# Patient Record
Sex: Male | Born: 1980 | Race: White | Hispanic: No | Marital: Single | Smoking: Never smoker
Health system: Southern US, Community
[De-identification: ages and names within clinical notes are randomized; demographics above are authoritative.]

## PROBLEM LIST (undated history)

## (undated) HISTORY — PX: ELEVATION OF DEPRESSED SKULL FRACTURE: SUR431

---

## 2010-02-14 DIAGNOSIS — R1033 Periumbilical pain: Secondary | ICD-10-CM | POA: Insufficient documentation

## 2010-02-17 ENCOUNTER — Ambulatory Visit: Payer: Self-pay | Admitting: Family Medicine

## 2010-02-17 ENCOUNTER — Emergency Department (HOSPITAL_COMMUNITY): Admission: EM | Admit: 2010-02-17 | Discharge: 2010-02-17 | Payer: Self-pay | Admitting: Emergency Medicine

## 2010-02-17 DIAGNOSIS — K219 Gastro-esophageal reflux disease without esophagitis: Secondary | ICD-10-CM

## 2010-05-24 NOTE — Assessment & Plan Note (Signed)
Summary: NEW TO EST-DIARRHEA X 4 DAYS-OK PER RACHEL//CCM   Vital Signs:  Patient profile:   30 year old male Height:      69 inches Weight:      176 pounds BMI:     26.08 Temp:     98.2 degrees F oral BP sitting:   110 / 80  (left arm) Cuff size:   regular  Vitals Entered By: Kern Reap CMA Duncan Dull) (February 17, 2010 1:50 PM) CC: new to establish care, NVD x 4 days Is Patient Diabetic? No   CC:  new to establish care and NVD x 4 days.  History of Present Illness: Larry Jacobs is a 30 year old single male, who comes in today with a 4-day history of nausea and vomiting, abdominal cramps, and diarrhea.  He states over the past weekend.  He lives in [place], [place], and ate some raw oysters.  About 10 hours later, he developed abdominal cramps, and diarrhea.  It has persisted since that, time.  He's been unable to eat.  Every time he eats he vomits.  He's had 3 loose problems today, which he states, are dark, coffee ground in appearance, but no blood or red.  He said any fever, chills, or urinary complaints.  Review of systems otherwise negative except for history of a traumatic injury resulted in a skull fracture.  It was required surgical intervention.  No residual brain damage  Preventive Screening-Counseling & Management  Alcohol-Tobacco     Smoking Status: never      Drug Use:  no.    Allergies (verified): 1)  Dilantin  Past History:  Past medical, surgical, family and social histories (including risk factors) reviewed for relevance to current acute and chronic problems.  Past Medical History: GERD  Past Surgical History: fractured skull  Family History: Reviewed history and no changes required. Father: GERD, Gout, HTN Mother: healthy Siblings: 2  brothers                1  gout               1 colitis  Social History: Reviewed history and no changes required. Single Never Smoked Alcohol use-yes Drug use-no Occupation: liquor rep Smoking Status:   never Drug Use:  no  Review of Systems      See HPI  Physical Exam  General:  Well-developed,well-nourished,in no acute distress; alert,appropriate and cooperative throughout examination Abdomen:  the abdomen is soft.  The bowel sounds are normal.  There is diffuse abdominal tenderness throughout the abdomen with referred rebound tenderness to the periumbilical area   Problems:  Medical Problems Added: 1)  Dx of Abdominal Pain, Periumbilical  (ICD-789.05) 2)  Dx of Genella Rife  (ICD-530.81)  Impression & Recommendations:  Problem # 1:  ABDOMINAL PAIN, PERIUMBILICAL (ICD-789.05) Assessment New  Patient Instructions: 1)  go directly to [facility] long emergency room   Orders Added: 1)  New Patient Level IV [57846]

## 2010-07-06 LAB — CBC
Hemoglobin: 17.8 g/dL — ABNORMAL HIGH (ref 13.0–17.0)
MCH: 33 pg (ref 26.0–34.0)
MCHC: 35.5 g/dL (ref 30.0–36.0)
MCV: 92.9 fL (ref 78.0–100.0)
Platelets: 249 10*3/uL (ref 150–400)
RBC: 5.39 MIL/uL (ref 4.22–5.81)

## 2010-07-06 LAB — DIFFERENTIAL
Basophils Relative: 1 % (ref 0–1)
Eosinophils Absolute: 0.3 10*3/uL (ref 0.0–0.7)
Eosinophils Relative: 3 % (ref 0–5)
Lymphs Abs: 2 10*3/uL (ref 0.7–4.0)
Monocytes Relative: 8 % (ref 3–12)

## 2010-07-06 LAB — POCT I-STAT, CHEM 8
Calcium, Ion: 1.18 mmol/L (ref 1.12–1.32)
Creatinine, Ser: 1.3 mg/dL (ref 0.4–1.5)
Glucose, Bld: 92 mg/dL (ref 70–99)
HCT: 52 % (ref 39.0–52.0)
Hemoglobin: 17.7 g/dL — ABNORMAL HIGH (ref 13.0–17.0)
TCO2: 31 mmol/L (ref 0–100)

## 2011-09-17 IMAGING — CT CT ABD-PELV W/ CM
1 series · 15 of 32 positions shown, 19 images · IV contrast (agent unspecified)
Comparison: None.

CLINICAL DATA: 29-year-old male with right lower quadrant abdominal
pain, nausea, vomiting.

CT ABDOMEN AND PELVIS WITH CONTRAST
TECHNIQUE: Multidetector CT imaging of the abdomen and pelvis was
performed following the standard protocol during bolus
administration of intravenous contrast.
Contrast: 100 ml 5mnipaque-6MM.

[Series 2: rtn ap with st · axial · 0.69mm/px · z∈[-328,+102]mm · 15 of 97 slices shown, 19 images]
[im 7/97  soft-tissue]
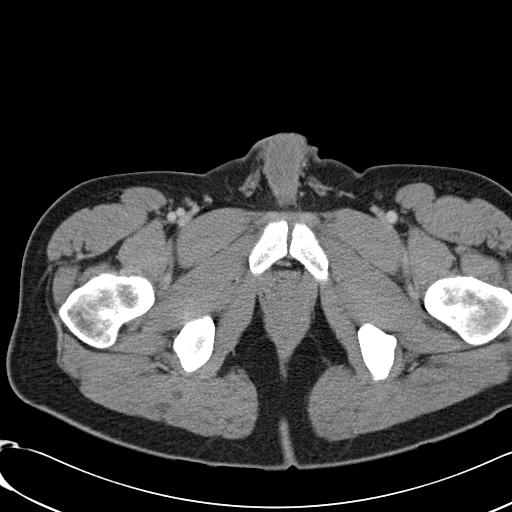
[im 7/97  bone]
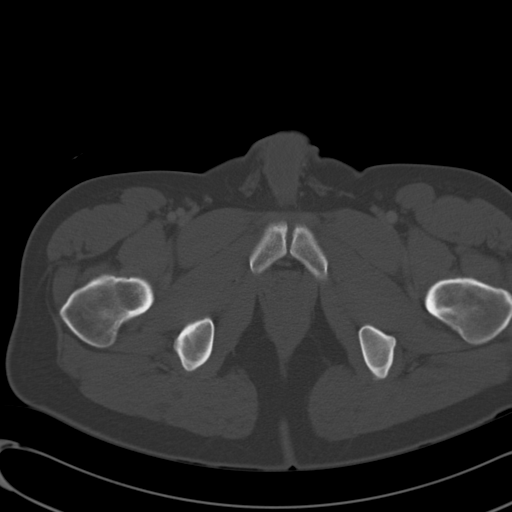
[im 13/97  soft-tissue]
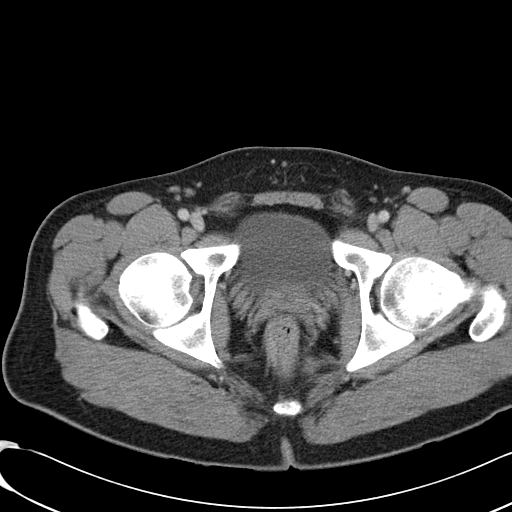
[im 19/97  soft-tissue]
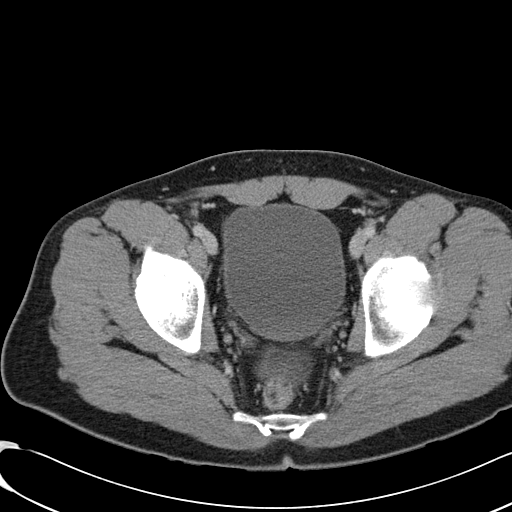
[im 28/97  soft-tissue]
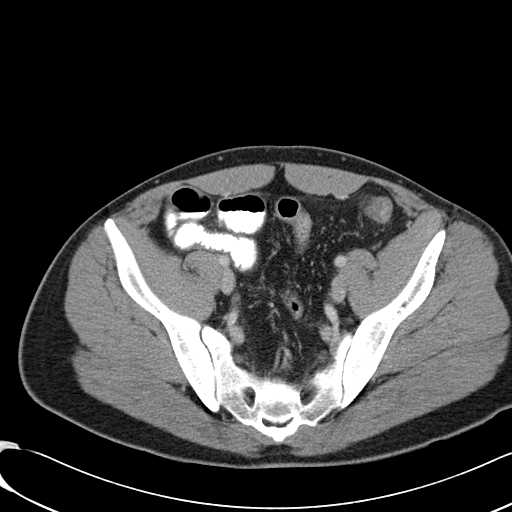
[im 35/97  soft-tissue]
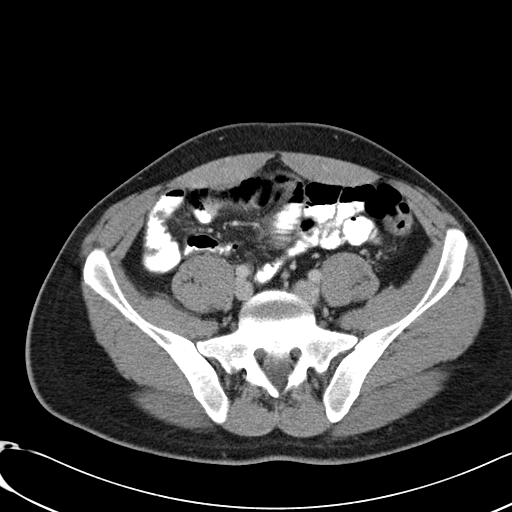
[im 41/97  soft-tissue]
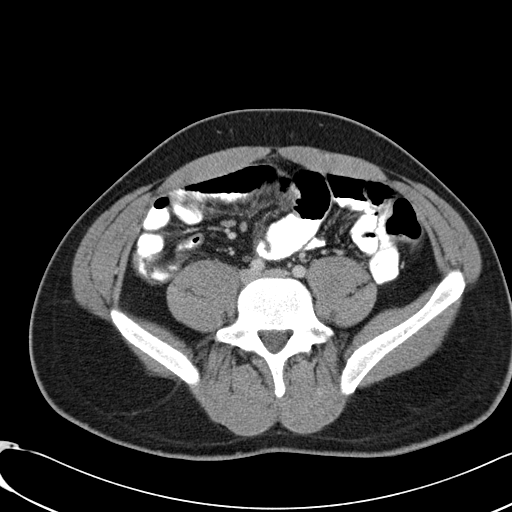
[im 50/97  soft-tissue]
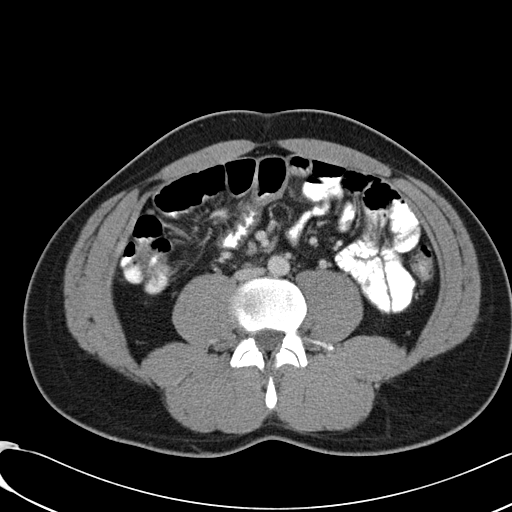
[im 56/97  soft-tissue]
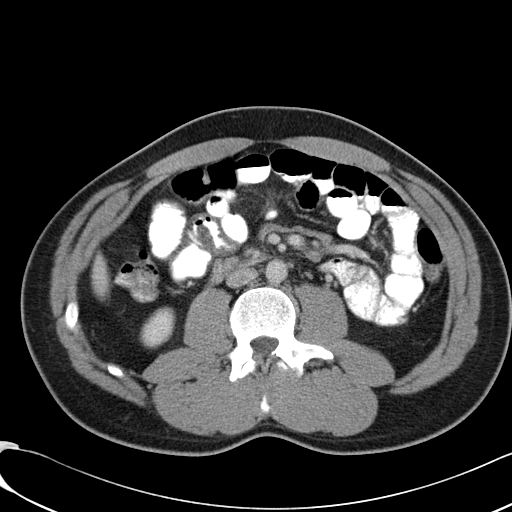
[im 62/97  soft-tissue]
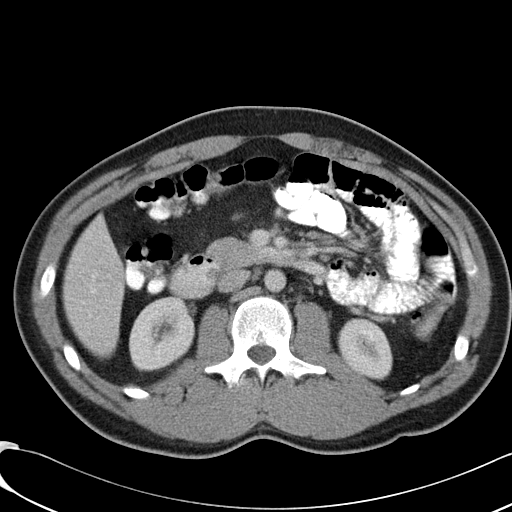
[im 62/97  bone]
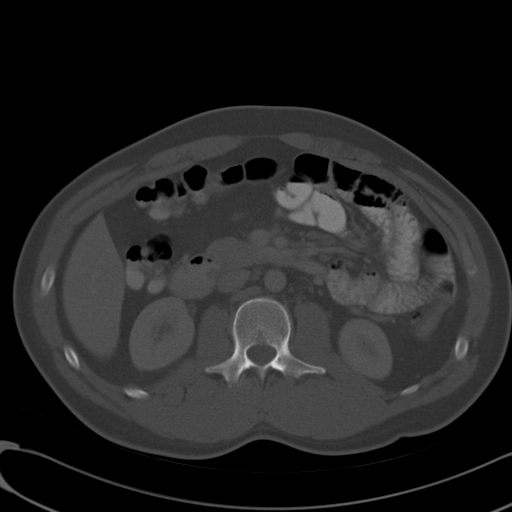
[im 69/97  soft-tissue]
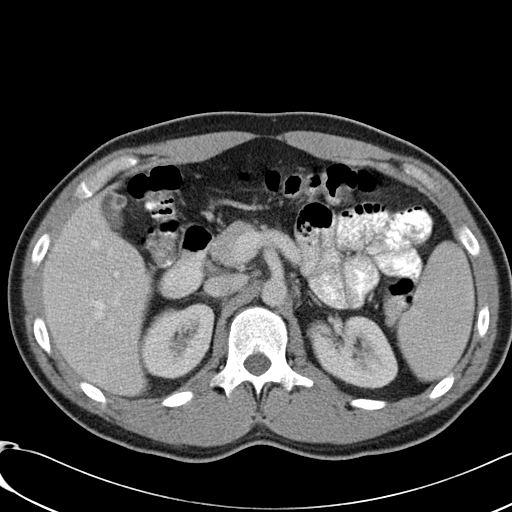
[im 78/97  soft-tissue]
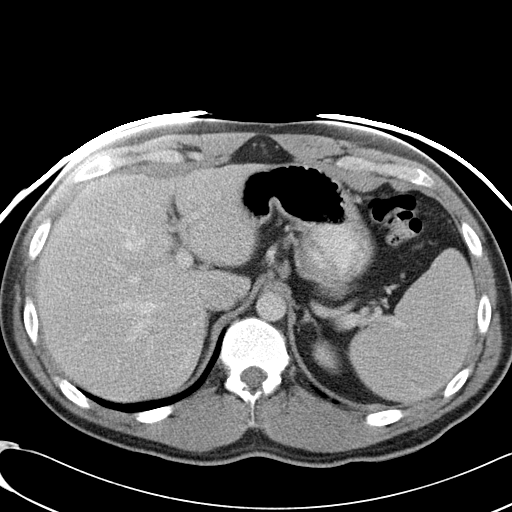
[im 84/97  soft-tissue]
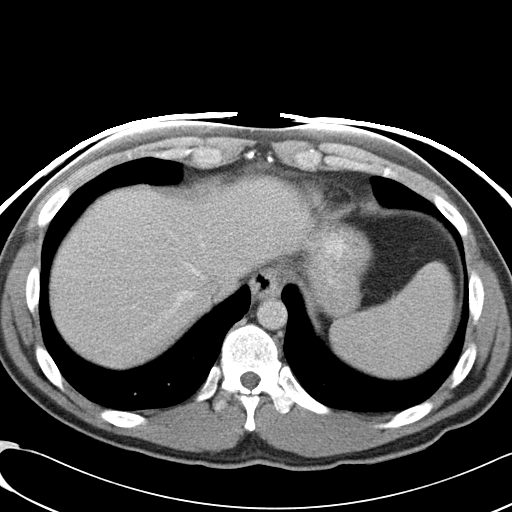
[im 84/97  lung]
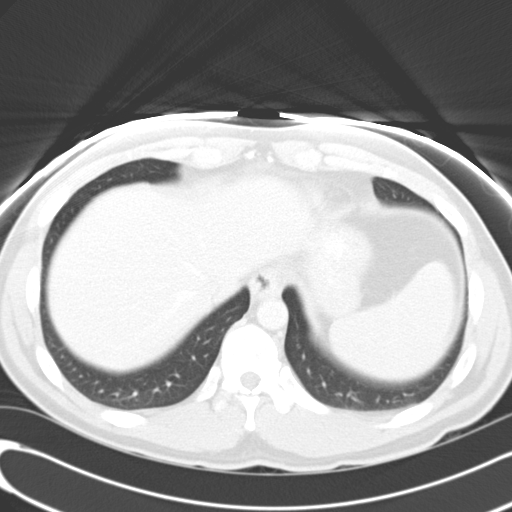
[im 87/97  lung]
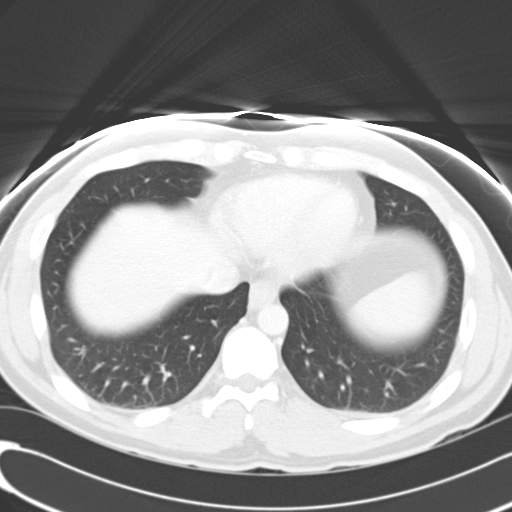
[im 90/97  soft-tissue]
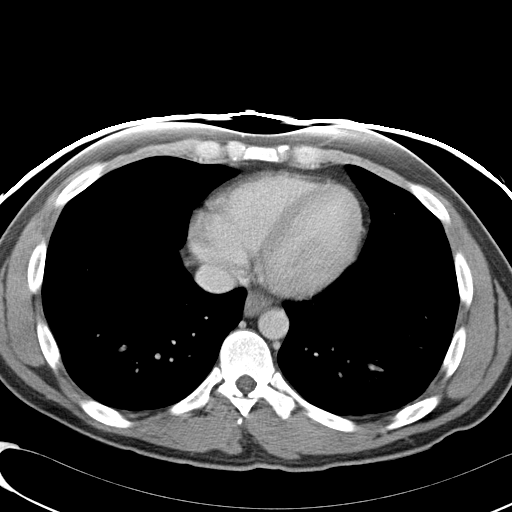
[im 90/97  lung]
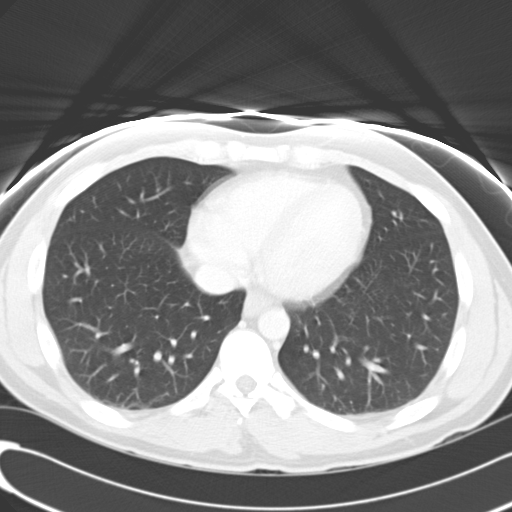
[im 93/97  lung]
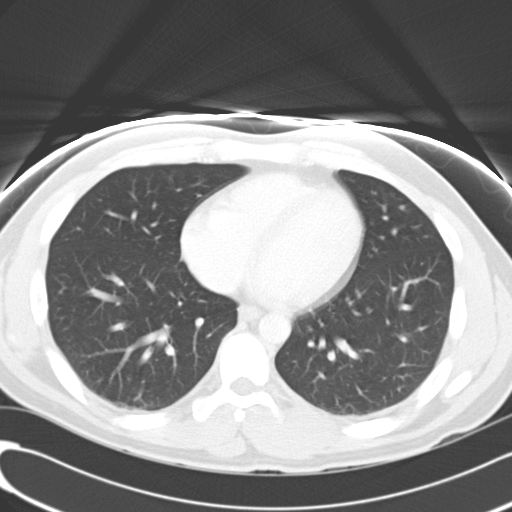

[15 of 32 positions shown; findings below may reference images not displayed]

FINDINGS: Minor atelectasis at the lung bases.No acute osseous
abnormality identified.  Small volume of pelvic free fluid.  Oral
contrast has reached the transverse colon.  The appendix appears
diminutive but not inflamed the located adjacent to the right psoas
muscle on series 2 image 58.  No focal colon inflammatory changes.

The bladder, liver, gallbladder, spleen, pancreas, adrenal glands,
kidneys, portal venous system, and major arterial structures are
within normal limits.  Stomach, duodenum, and proximal small bowel
loops are within normal limits.

Prominent small bowel mesenteric lymph nodes measure up to 8 mm in
short axis.  Distal small bowel loops appear mildly thickened and
irregular ( series 2 image 60, coronal image 37), but the terminal
ileum itself does not appear severely affected.
IMPRESSION: 1.  Mild thickening of distal small bowel loops, reactive type
lymphadenopathy in the lower small bowel mesentery, and small
volume free fluid in the pelvis.  Suspect acute infectious
enteritis versus mesenteric adenitis.
2.  No convincing evidence of acute appendicitis.

## 2012-02-21 ENCOUNTER — Encounter (HOSPITAL_COMMUNITY): Payer: Self-pay

## 2012-02-21 ENCOUNTER — Emergency Department (HOSPITAL_COMMUNITY)
Admission: EM | Admit: 2012-02-21 | Discharge: 2012-02-21 | Disposition: A | Discharge status: Home / Self Care | Payer: BC Managed Care – PPO | Source: Home / Self Care | Attending: Family Medicine | Admitting: Family Medicine

## 2012-02-21 DIAGNOSIS — J329 Chronic sinusitis, unspecified: Secondary | ICD-10-CM

## 2012-02-21 MED ORDER — AMOXICILLIN-POT CLAVULANATE 875-125 MG PO TABS: 1.0000 | ORAL_TABLET | Freq: Two times a day (BID) | ORAL | Status: DC

## 2012-02-21 NOTE — ED Notes (Signed)
Patient complains of sinus pain, pressure and drainage  X 4 days , OTC not helpful

## 2012-02-21 NOTE — ED Provider Notes (Signed)
History     CSN: [id number]  Arrival date & time 02/21/12  1205   First MD Initiated Contact with Patient 02/21/12 1440      Chief Complaint  Patient presents with  . Facial Pain    (Consider location/radiation/quality/duration/timing/severity/associated sxs/prior treatment) Patient is a 31 y.o. male presenting with cough. The history is provided by the patient. No language interpreter was used.  Cough This is a new problem. The problem occurs constantly. The problem has been gradually worsening. The cough is productive of sputum. There has been no fever. Associated symptoms include ear congestion, ear pain and rhinorrhea. He is not a smoker. His past medical history does not include pneumonia.  Pt complains of a sinus infection  History reviewed. No pertinent past medical history.  History reviewed. No pertinent past surgical history.  No family history on file.  History  Substance Use Topics  . Smoking status: Never Smoker   . Smokeless tobacco: Not on file  . Alcohol Use: Yes      Review of Systems  HENT: Positive for ear pain and rhinorrhea.   Respiratory: Positive for cough.   All other systems reviewed and are negative.    Allergies  Phenytoin  Home Medications  No current outpatient prescriptions on file.  BP 115/79  Pulse 88  Temp 98.2 F (36.8 C) (Oral)  Resp 22  SpO2 97%  Physical Exam  Vitals reviewed. Constitutional: He appears well-developed and well-nourished.  HENT:  Head: Normocephalic and atraumatic.  Right Ear: External ear normal.  Left Ear: External ear normal.  Nose: Nose normal.  Mouth/Throat: Oropharynx is clear and moist.       Tender maxillary sinuses  Eyes: Conjunctivae normal and EOM are normal. Pupils are equal, round, and reactive to light.  Neck: Normal range of motion. Neck supple.  Cardiovascular: Normal rate and normal heart sounds.   Pulmonary/Chest: Effort normal.  Abdominal: Soft.  Musculoskeletal: Normal range  of motion.  Neurological: He is alert.  Skin: Skin is warm.    ED Course  Procedures (including critical care time)  Labs Reviewed - No data to display No results found.   No diagnosis found.    MDM  augmentin 875 x a0 days        Lonia Skinner [place], [place] 02/21/12 1444

## 2012-02-22 NOTE — ED Provider Notes (Signed)
Medical screening examination/treatment/procedure(s) were performed by non-physician practitioner and as supervising physician I was immediately available for consultation/collaboration.   [facility]; MD   Sharin Grave, MD 02/22/12 [phone]

## 2015-05-12 ENCOUNTER — Encounter: Payer: Self-pay | Admitting: Allergy and Immunology

## 2015-05-12 ENCOUNTER — Ambulatory Visit (INDEPENDENT_AMBULATORY_CARE_PROVIDER_SITE_OTHER): Payer: BLUE CROSS/BLUE SHIELD | Admitting: Allergy and Immunology

## 2015-05-12 VITALS — BP 110/76 | HR 76 | Temp 98.0°F | Resp 16 | Ht 69.29 in | Wt 175.3 lb

## 2015-05-12 DIAGNOSIS — J309 Allergic rhinitis, unspecified: Secondary | ICD-10-CM

## 2015-05-12 DIAGNOSIS — H1010 Acute atopic conjunctivitis, unspecified eye: Secondary | ICD-10-CM | POA: Diagnosis not present

## 2015-05-12 MED ORDER — OLOPATADINE HCL 0.7 % OP SOLN: 1.0000 [drp] | OPHTHALMIC | Status: AC

## 2015-05-12 MED ORDER — MONTELUKAST SODIUM 10 MG PO TABS: 10.0000 mg | ORAL_TABLET | Freq: Every day | ORAL | Status: DC

## 2015-05-12 NOTE — Patient Instructions (Signed)
  1.  Allergen avoidance measures  2. Treat and prevent inflammation:   A. OTC Rhinocort - one spray each nostril 3-7 times per week  B. Montelukast 10 mg one tablet one time per day  3. If needed:   A. OTC antihistamine.  [name] - one drop each eye one time per day. Can use prior to contact insertion.   4. Immunotherapy?  5. Return in 12 weeks or earlier if problem.

## 2015-05-12 NOTE — Progress Notes (Signed)
[facility]    NEW PATIENT NOTE  Referring Provider: No ref. provider found Primary Provider: No PCP Per Patient Date of office visit: 05/12/2015    Subjective:   Chief Complaint:  Larry Jacobs is a 35 y.o. male with a chief complaint of New Patient (Initial Visit)  who presents to the clinic with the following problems:  HPI Comments: Larry Jacobs presents to this clinic on 05/12/2015 in evaluation of allergies. He's had a long history of problems with nasal congestion and lowing of his nose and intermittent sneezing and itchy watery eyes usually occurring during the spring and fall season initially. Unfortunately, over the course of the past several years he has now developed perennial symptoms. He does not note any obvious provoking factor giving rise to these symptoms. He does not have any associated anosmia or headaches or ugly nasal discharge or other atopic disease. He has tried various medications including what sounds like nasal steroids and he's used antihistamines.   History reviewed. No pertinent past medical history.  Past Surgical History  Procedure Laterality Date  . Elevation of depressed skull fracture       Current outpatient prescriptions:  .  cetirizine (ZYRTEC) 10 MG tablet, Take 10 mg by mouth as needed for allergies., Disp: , Rfl:  .  amoxicillin-clavulanate (AUGMENTIN) 875-125 MG per tablet, Take 1 tablet by mouth 2 (two) times daily. (Patient not taking: Reported on 05/12/2015), Disp: 20 tablet, Rfl: 0  No orders of the defined types were placed in this encounter.    Allergies  Allergen Reactions  . Phenytoin     REACTION: hives    Review of systems negative except as noted in HPI / PMHx or noted below:  Review of Systems  Constitutional: Negative.   HENT: Negative.   Eyes: Negative.        Corrective contact ware  Respiratory: Negative.   Cardiovascular: Negative.   Gastrointestinal:  Negative.   Genitourinary: Negative.   Musculoskeletal: Negative.   Skin: Negative.   Neurological: Negative.   Endo/Heme/Allergies: Negative.   Psychiatric/Behavioral: Negative.     Family History  Problem Relation Age of Onset  . Asthma Father     Social History   Social History  . Marital Status: Single    Spouse Name: N/A  . Number of Children: N/A  . Years of Education: N/A   Occupational History  . Not on file.   Social History Main Topics  . Smoking status: Never Smoker   . Smokeless tobacco: Not on file  . Alcohol Use: 1.8 oz/week    3 Shots of liquor per week  . Drug Use: No  . Sexual Activity: Not on file   Other Topics Concern  . Not on file   Social History Narrative    Environmental and Social history  Lives in a house with a dry environment, a dog located inside the household, no carpeting in the bedroom, sleeping on a mattress without plastic for the better pillow, and no smokers located inside the household. 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   Objective:   Filed Vitals:   05/12/15 0900  BP: 110/76  Pulse: 76  Temp: 98 F (36.7 C)  Resp: 16   Height: 5' 9.29" (176 cm) Weight: 175 lb 4.3 oz (79.5 kg)  Physical Exam  Constitutional: He is well-developed, well-nourished, and in no distress. No distress.  HENT:  Head: Normocephalic. Head is without  right periorbital erythema and without left periorbital erythema.  Right Ear: Tympanic membrane, external ear and ear canal normal.  Left Ear: Tympanic membrane, external ear and ear canal normal.  Nose: Nose normal. No mucosal edema or rhinorrhea.  Mouth/Throat: Oropharynx is clear and moist and mucous membranes are normal. No oropharyngeal exudate.  Eyes: Conjunctivae and lids are normal. Pupils are equal, round, and reactive to light.  Neck: Trachea normal. No tracheal deviation present. No thyromegaly present.  Cardiovascular: Normal rate, regular rhythm, S1  normal, S2 normal and normal heart sounds.   No murmur heard. Pulmonary/Chest: Effort normal. No stridor. No tachypnea. No respiratory distress. He has no wheezes. He has no rales. He exhibits no tenderness.  Abdominal: Soft. He exhibits no distension and no mass. There is no hepatosplenomegaly. There is no tenderness. There is no rebound and no guarding.  Musculoskeletal: He exhibits no edema or tenderness.  Lymphadenopathy:       Head (right side): No tonsillar adenopathy present.       Head (left side): No tonsillar adenopathy present.    He has no cervical adenopathy.    He has no axillary adenopathy.  Neurological: He is alert. Gait normal.  Skin: No rash noted. He is not diaphoretic. No erythema. No pallor. Nails show no clubbing.  Psychiatric: Mood and affect normal.     Diagnostics:  Allergy skin tests were performed. He demonstrated severe hypersensitivity against house dust mite on epi cutaneous testing and he demonstrated hypersensitivity against mold on intradermal testing  Assessment and Plan:    1. Allergic rhinoconjunctivitis     1.  Allergen avoidance measures  2. Treat and prevent inflammation:   A. OTC Rhinocort - one spray each nostril 3-7 times per week  B. Montelukast 10 mg one tablet one time per day  3. If needed:   A. OTC antihistamine.  [name] - one drop each eye one time per day. Can use prior to contact insertion.   4. Immunotherapy?  5. Return in 12 weeks or earlier if problem.  Larry Jacobs will perform allergen avoidance measures and use anti-inflammatory medications on a regular basis and will see how things go over the course of the next 12 weeks. If he fails medical therapy he would definitely be a candidate for immunotherapy.  Laurette Schimke, MD [facility]

## 2015-06-06 ENCOUNTER — Emergency Department (HOSPITAL_COMMUNITY)
Admission: EM | Admit: 2015-06-06 | Discharge: 2015-06-06 | Disposition: A | Discharge status: Home / Self Care | Payer: BLUE CROSS/BLUE SHIELD | Attending: Emergency Medicine | Admitting: Emergency Medicine

## 2015-06-06 DIAGNOSIS — Z79899 Other long term (current) drug therapy: Secondary | ICD-10-CM | POA: Diagnosis not present

## 2015-06-06 DIAGNOSIS — J029 Acute pharyngitis, unspecified: Secondary | ICD-10-CM | POA: Insufficient documentation

## 2015-06-06 DIAGNOSIS — R52 Pain, unspecified: Secondary | ICD-10-CM | POA: Diagnosis present

## 2015-06-06 DIAGNOSIS — R531 Weakness: Secondary | ICD-10-CM | POA: Insufficient documentation

## 2015-06-06 LAB — BASIC METABOLIC PANEL
ANION GAP: 7 (ref 5–15)
BUN: 14 mg/dL (ref 6–20)
CHLORIDE: 104 mmol/L (ref 101–111)
CO2: 26 mmol/L (ref 22–32)
CREATININE: 1.02 mg/dL (ref 0.61–1.24)
Calcium: 8.6 mg/dL — ABNORMAL LOW (ref 8.9–10.3)
GFR calc non Af Amer: >60 mL/min (ref >60)
GLUCOSE: 98 mg/dL (ref 65–99)
Potassium: 4.2 mmol/L (ref 3.5–5.1)
Sodium: 137 mmol/L (ref 135–145)

## 2015-06-06 LAB — CK: Total CK: 352 U/L (ref 49–397)

## 2015-06-06 LAB — HEPATIC FUNCTION PANEL
ALBUMIN: 4.2 g/dL (ref 3.5–5.0)
ALK PHOS: 48 U/L (ref 38–126)
ALT: 20 U/L (ref 17–63)
AST: 33 U/L (ref 15–41)
Bilirubin, Direct: 0.2 mg/dL (ref 0.1–0.5)
Indirect Bilirubin: 0.7 mg/dL (ref 0.3–0.9)
TOTAL PROTEIN: 6.8 g/dL (ref 6.5–8.1)
Total Bilirubin: 0.9 mg/dL (ref 0.3–1.2)

## 2015-06-06 LAB — CBC WITH DIFFERENTIAL/PLATELET
BASOS PCT: 0 %
Basophils Absolute: 0 10*3/uL (ref 0.0–0.1)
Eosinophils Absolute: 0.1 10*3/uL (ref 0.0–0.7)
Eosinophils Relative: 1 %
HEMATOCRIT: 42.3 % (ref 39.0–52.0)
HEMOGLOBIN: 15.4 g/dL (ref 13.0–17.0)
LYMPHS PCT: 31 %
Lymphs Abs: 2.1 10*3/uL (ref 0.7–4.0)
MCH: 31.3 pg (ref 26.0–34.0)
MCHC: 36.4 g/dL — AB (ref 30.0–36.0)
MCV: 86 fL (ref 78.0–100.0)
MONOS PCT: 13 %
Monocytes Absolute: 0.9 10*3/uL (ref 0.1–1.0)
NEUTROS ABS: 3.8 10*3/uL (ref 1.7–7.7)
NEUTROS PCT: 55 %
Platelets: 189 10*3/uL (ref 150–400)
RBC: 4.92 MIL/uL (ref 4.22–5.81)
RDW: 12.3 % (ref 11.5–15.5)
WBC: 6.8 10*3/uL (ref 4.0–10.5)

## 2015-06-06 LAB — URINALYSIS, ROUTINE W REFLEX MICROSCOPIC
Bilirubin Urine: NEGATIVE
Glucose, UA: NEGATIVE mg/dL
Hgb urine dipstick: NEGATIVE
Ketones, ur: NEGATIVE mg/dL
LEUKOCYTES UA: NEGATIVE
NITRITE: NEGATIVE
PH: 6 (ref 5.0–8.0)
Protein, ur: NEGATIVE mg/dL
SPECIFIC GRAVITY, URINE: 1.022 (ref 1.005–1.030)

## 2015-06-06 MED ORDER — ACETAMINOPHEN 500 MG PO TABS: 500.0000 mg | ORAL_TABLET | Freq: Four times a day (QID) | ORAL | Status: AC | PRN

## 2015-06-06 MED ORDER — IBUPROFEN 600 MG PO TABS: 600.0000 mg | ORAL_TABLET | Freq: Four times a day (QID) | ORAL | Status: AC | PRN

## 2015-06-06 MED ORDER — SODIUM CHLORIDE 0.9 % IV BOLUS (SEPSIS)
1000.0000 mL | Freq: Once | INTRAVENOUS | Status: AC
  Administered 2015-06-06: 1000 mL via INTRAVENOUS

## 2015-06-06 MED ORDER — KETOROLAC TROMETHAMINE 30 MG/ML IJ SOLN
30.0000 mg | Freq: Once | INTRAMUSCULAR | Status: AC
  Administered 2015-06-06: 30 mg via INTRAVENOUS
  Filled 2015-06-06: qty 1

## 2015-06-06 NOTE — Discharge Instructions (Signed)
1. Medications: tylenol and ibuprofen for body aches, usual home medications 2. Treatment: rest, drink plenty of fluids 3. Follow Up: please followup with your primary doctor in 2-3 days for discussion of your diagnoses and further evaluation after today's visit; if you do not have a primary care doctor use the resource guide provided to find one; please return to the ER for high fever, new or worsening symptoms   Musculoskeletal Pain Musculoskeletal pain is muscle and boney aches and pains. These pains can occur in any part of the body. Your caregiver may treat you without knowing the cause of the pain. They may treat you if blood or urine tests, X-rays, and other tests were normal.  CAUSES There is often not a definite cause or reason for these pains. These pains may be caused by a type of germ (virus). The discomfort may also come from overuse. Overuse includes working out too hard when your body is not fit. Boney aches also come from weather changes. Bone is sensitive to atmospheric pressure changes. HOME CARE INSTRUCTIONS   Ask when your test results will be ready. Make sure you get your test results.  Only take over-the-counter or prescription medicines for pain, discomfort, or fever as directed by your caregiver. If you were given medications for your condition, do not drive, operate machinery or power tools, or sign legal documents for 24 hours. Do not drink alcohol. Do not take sleeping pills or other medications that may interfere with treatment.  Continue all activities unless the activities cause more pain. When the pain lessens, slowly resume normal activities. Gradually increase the intensity and duration of the activities or exercise.  During periods of severe pain, bed rest may be helpful. Lay or sit in any position that is comfortable.  Putting ice on the injured area.  Put ice in a bag.  Place a towel between your skin and the bag.  Leave the ice on for 15 to 20 minutes, 3  to 4 times a day.  Follow up with your caregiver for continued problems and no reason can be found for the pain. If the pain becomes worse or does not go away, it may be necessary to repeat tests or do additional testing. Your caregiver may need to look further for a possible cause. SEEK IMMEDIATE MEDICAL CARE IF:  You have pain that is getting worse and is not relieved by medications.  You develop chest pain that is associated with shortness or breath, sweating, feeling sick to your stomach (nauseous), or throw up (vomit).  Your pain becomes localized to the abdomen.  You develop any new symptoms that seem different or that concern you. MAKE SURE YOU:   Understand these instructions.  Will watch your condition.  Will get help right away if you are not doing well or get worse.   This information is not intended to replace advice given to you by your health care provider. Make sure you discuss any questions you have with your health care provider.   Document Released: 04/10/2005 Document Revised: 07/03/2011 Document Reviewed: 12/13/2012 Elsevier Interactive Patient Education 2016 Elsevier Inc.  Sore Throat A sore throat is pain, burning, irritation, or scratchiness of the throat. There is often pain or tenderness when swallowing or talking. A sore throat may be accompanied by other symptoms, such as coughing, sneezing, fever, and swollen neck glands. A sore throat is often the first sign of another sickness, such as a cold, flu, strep throat, or mononucleosis (commonly known as  mono). Most sore throats go away without medical treatment. CAUSES  The most common causes of a sore throat include:  A viral infection, such as a cold, flu, or mono.  A bacterial infection, such as strep throat, tonsillitis, or whooping cough.  Seasonal allergies.  Dryness in the air.  Irritants, such as smoke or pollution.  Gastroesophageal reflux disease (GERD). HOME CARE INSTRUCTIONS   Only take  over-the-counter medicines as directed by your caregiver.  Drink enough fluids to keep your urine clear or pale yellow.  Rest as needed.  Try using throat sprays, lozenges, or sucking on hard candy to ease any pain (if older than 4 years or as directed).  Sip warm liquids, such as broth, herbal tea, or warm water with honey to relieve pain temporarily. You may also eat or drink cold or frozen liquids such as frozen ice pops.  Gargle with salt water (mix 1 tsp salt with 8 oz of water).  Do not smoke and avoid secondhand smoke.  Put a cool-mist humidifier in your bedroom at night to moisten the air. You can also turn on a hot shower and sit in the bathroom with the door closed for 5-10 minutes. SEEK IMMEDIATE MEDICAL CARE IF:  You have difficulty breathing.  You are unable to swallow fluids, soft foods, or your saliva.  You have increased swelling in the throat.  Your sore throat does not get better in 7 days.  You have nausea and vomiting.  You have a fever or persistent symptoms for more than 2-3 days.  You have a fever and your symptoms suddenly get worse. MAKE SURE YOU:   Understand these instructions.  Will watch your condition.  Will get help right away if you are not doing well or get worse.   This information is not intended to replace advice given to you by your health care provider. Make sure you discuss any questions you have with your health care provider.   Document Released: 05/18/2004 Document Revised: 05/01/2014 Document Reviewed: 12/17/2011 Elsevier Interactive Patient Education 2016 [employer].   Emergency Department Resource Guide 1) Find a Doctor and Pay Out of Pocket Although you won't have to find out who is covered by your insurance plan, it is a good idea to ask around and get recommendations. You will then need to call the office and see if the doctor you have chosen will accept you as a new patient and what types of options they offer for  patients who are self-pay. Some doctors offer discounts or will set up payment plans for their patients who do not have insurance, but you will need to ask so you aren't surprised when you get to your appointment.  2) Contact Your Local Health Department Not all health departments have doctors that can see patients for sick visits, but many do, so it is worth a call to see if yours does. If you don't know where your local health department is, you can check in your phone book. The CDC also has a tool to help you locate your state's health department, and many state websites also have listings of all of their local health departments.  3) Find a Walk-in Clinic If your illness is not likely to be very severe or complicated, you may want to try a walk in clinic. These are popping up all over the country in pharmacies, drugstores, and shopping centers. They're usually staffed by nurse practitioners or physician assistants that have been trained to treat common  illnesses and complaints. They're usually fairly quick and inexpensive. However, if you have serious medical issues or chronic medical problems, these are probably not your best option.  No Primary Care Doctor: - Call Health Connect at  [phone] - they can help you locate a primary care doctor that  accepts your insurance, provides certain services, etc. - Physician Referral Service- [phone]  Chronic Pain Problems: Organization         Address  Phone   Notes  [facility]  [phone] Patients need to be referred by their primary care doctor.   Medication Assistance: Organization         Address  Phone   Notes  [facility] Medication [facility] [address] [place], [place] [address] [phone] --Must be a resident of [facility] -- Must have NO insurance coverage whatsoever (no Medicaid/ Medicare, etc.) -- The pt. MUST have a primary care doctor that directs their care regularly  and follows them in the community   [facility]  [phone]   [employer]  [phone]    Agencies that provide inexpensive medical care: Organization         Address  Phone   Notes  [facility]  [phone]   [facility]    [phone]   [facility] [address] [place], [place] [address] [phone]   [facility] [address], [place] [phone]   Planned Parenthood    [phone]   [facility]    [phone]   [facility]  [address], [place] Phone:  [phone], Fax:  [phone] Hours of Operation:  9 am - 6 pm, M-F.  Also accepts Medicaid/Medicare and self-pay.  [facility]  [address], [place] Phone: [phone], Fax: [phone]. Hours of Operation:  8:30 am - 5:30 pm, M-F.  Also accepts Medicaid and self-pay.  [facility] [place] [address], [place] Phone: [phone]   [facility] [address] Natasha Bence [place], [place] [phone], Ext. [phone] Mondays & Thursdays: 7-9 AM.  First 15 patients are seen on a first come, first serve basis.    Medicaid-accepting [facility] Providers:  Organization         Address  Phone   Notes  [facility] [address], [place] [phone] Also accepts self-pay patients.  [facility] [address] Laurell Josephs [place], [place]  [phone]   [facility] [address], [place] [phone]   [facility] [address] [place] [phone]   Renaye Rakers [address], [place]   [phone] Only accepts [place] Access [place] patients after they have their name applied to their card.   Self-Pay (no insurance) in [facility]:  Organization         Address  Phone   Notes  Sickle  Cell Patients, [facility] Internal Medicine [address] [place], [place] [phone]   [facility] [address] [place], [place] [phone]   [facility] [place]  [address], [place] [phone]   [facility]/Dr. Osei-Bonsu  [address], [place] or [address], Laure[address],  [place] [phone] Phone number for both [facility] and [place] locations is the same.  [facility] [address] [place] [phone]   [facility] [address] [place] or [address] [phone]   [facility] [address], [place] [phone], phone; [phone], fax Sees patients 1st and 3rd Saturday of every month.  Must not qualify for public or private insurance (i.e. Medicaid, Medicare, [place] Health Choice, Veterans' Benefits)  Household income should be no more than 200% of the poverty level The clinic cannot treat you if you are pregnant or think you are pregnant  Sexually transmitted diseases are not treated at the clinic.    Dental Care: Organization         Address  Phone  Notes  [facility] Department of [facility] [address] [place], [place] [phone] Accepts children up to age 63 who are enrolled in [place] or [place] Health Choice; pregnant women with a Medicaid card; and children who have applied for Medicaid or [place] Health Choice, but were declined, whose parents can pay a reduced fee at time of service.  [facility] Department of [facility]  [address], [place] [phone] Accepts children up to age 43 who are enrolled in [place] or [place] Health Choice; pregnant women with a Medicaid card; and children who have applied for Medicaid or [place] Health Choice, but were declined, whose parents can pay a reduced fee at time of service.  [facility]  [address] [place], [place] [phone] Patients are seen by appointment only. Walk-ins are not accepted. [facility] will see patients 82 years of age and older. Monday - Tuesday (8am-5pm) Most Wednesdays (8:30-5pm) $30 per visit, cash only  [facility] Adult Dental Access PROGRAM  [address], [facility] [phone] Patients are seen by appointment only. Walk-ins are not accepted. [facility] will see patients 42 years of age and older. One Wednesday Evening (Monthly: Volunteer Based).  $30 per visit, cash only  [employer]  [phone] for adults; Children under age 34, call Graduate Pediatric Dentistry at [phone]. Children aged 60-14, please call [phone] to request a pediatric application.  Dental services are provided in all areas of dental care including fillings, crowns and bridges, complete and partial dentures, implants, gum treatment, root canals, and extractions. Preventive care is also provided. Treatment is provided to both adults and children. Patients are selected via a lottery and there is often a waiting list.   [facility] [address] [place]  [phone] [email or web address]   [facility] [address] [place], [place] [phone], Ext. [phone] Second and Fourth Thursday of each month, opens at 6:30 AM; Clinic ends at 9 AM.  Patients are seen on a first-come first-served basis, and a limited number are seen during each clinic.   [facility]  [address] Ether Griffins [place], [place] [phone]   Eligibility Requirements You must have lived in [place], [place], or [place] counties for at least the last three months.   You cannot be eligible for state or federal sponsored [place], including [employer], [place], or [employer].   You generally cannot be eligible for healthcare insurance through your employer.    How to apply: Eligibility  screenings are held every Tuesday and  Wednesday afternoon from 1:00 pm until 4:00 pm. You do not need an appointment for the interview!  [facility] [address] [place], [place] [phone]   [facility] Health Department  [phone]   [facility] Health Department  [phone]   [facility] Health Department  [phone]    Behavioral Health Resources in the Community: Intensive Outpatient Programs Organization         Address  Phone  Notes  [facility] Services [address], [place], [place] [phone]   [facility] Outpatient [address], [place], [place] [phone]   [facility] [address] [place], [place]  [phone]   [facility] Mental Health [address],  [place], [place] [phone] or [phone]   Substance Abuse Resources Organization         Address  Phone  Notes  [facility]  [phone]   [facility]  [phone]   [facility]  [phone]   Floydene Flock  [phone]   Residential & Outpatient Substance Abuse Program  [phone]   Psychological Services Organization         Address  Phone  Notes  [facility]  [phone]   [facility]  [phone]   [facility] Mental Health [address], [place] [phone] or [phone]    Mobile Crisis Teams Organization         Address  Phone  Notes  [facility], Mobile Crisis Care Unit  [phone]   [facility]  [address] [place], [place] [phone]   Doristine Locks [address] [place] [phone]    Self-Help/Support Groups Organization         Address  Phone             Notes  [facility] [place] - variety of support groups  336- I7437963 Call for more information  Narcotics Anonymous (NA), [facility] [address], [employer] [place]  2 meetings at  this location   Statistician         Address  Phone  Notes  [facility] [address],    [place]  [phone]   [facility]  [address] [place] [address], [place], [place] [phone]   [facility] [address] [place], [place] [phone] Admissions: 8am-3pm M-F  [facility] [address]    [place], [place] [phone]   [facility] [address] [place], [place], [place] [phone]   [facility] [address]  [place], [place] [phone]   [facility] - Intensive Outpatient [address] Laurell [address], [place], [place] [phone]   [facility] (Addiction Recovery [facility] [address] [place]  [place], [place] [phone] or [phone]   [facility] ([facility]) [address] [place], [place] [phone] Accepts Medicaid  [facility] [address]  [place] [phone] Substance Abuse/Addiction Treatment   [facility] Organization         Address  Phone  Notes  [facility]  [phone]   Angie Fava, PhD [address] Ervin Knack [place], [place]   [phone] or [phone]   [facility] Behavioral   [address] [place], [place] [phone]   [facility] [address] [place], [place] [phone] Insurance/Medicaid/sponsorship through [employer] and  Families [address]                                    [place], [place] [phone] Therapy/tele-psych/case  [facility] [address]   [place], [place] [phone]    Dr. Lolly Mustache  [phone]   [facility]  [employer] [facility] Dept. 1) [address], [place] 2) [address], [place] 3)  [address], [place] [phone]  [phone]   [facility] Child Abuse Hotline [phone] or [phone] (After Hours)

## 2015-06-06 NOTE — ED Provider Notes (Signed)
CSN: [id number]     Arrival date & time 06/06/15  1443 History   First MD Initiated Contact with Patient 06/06/15 1931     Chief Complaint  Patient presents with  . Generalized Body Aches    HPI   Larry Jacobs is a 35 y.o. male with no pertinent PMH who presents to the ED with sore throat and generalized body aches, which she states started on Thursday. He notes his symptoms initially improved yesterday, however recurred today. He reports movement exacerbates his body aches. He has tried over-the-counter cough and cold medication with no significant symptom relief. He reports fever to 100 at home. He denies chills. He denies headache, lightheadedness, dizziness, cough, congestion, difficulty swallowing or handling his secretions, chest pain, shortness of breath, abdominal pain, nausea, vomiting, diarrhea, constipation, dysuria, urgency, frequency, numbness, paresthesia. He denies change in medications or recent environmental exposure.   No past medical history on file. Past Surgical History  Procedure Laterality Date  . Elevation of depressed skull fracture     Family History  Problem Relation Age of Onset  . Asthma Father    Social History  Substance Use Topics  . Smoking status: Never Smoker   . Smokeless tobacco: Not on file  . Alcohol Use: 1.8 oz/week    3 Shots of liquor per week      Review of Systems  Constitutional: Positive for fever and fatigue. Negative for chills.  HENT: Positive for sore throat. Negative for congestion.   Respiratory: Negative for cough and shortness of breath.   Cardiovascular: Negative for chest pain.  Gastrointestinal: Negative for nausea, vomiting, abdominal pain, diarrhea and constipation.  Genitourinary: Negative for dysuria, urgency and frequency.  Musculoskeletal: Positive for myalgias.  Neurological: Positive for weakness. Negative for dizziness, syncope, light-headedness, numbness and headaches.  All other systems reviewed and are  negative.     Allergies  Phenytoin  Home Medications   Prior to Admission medications   Medication Sig Start Date End Date Taking? Authorizing Provider  budesonide (RHINOCORT ALLERGY) 32 MCG/ACT nasal spray Place 1 spray into both nostrils daily as needed for allergies.   Yes Historical Provider, MD  cetirizine (ZYRTEC) 10 MG tablet Take 10 mg by mouth as needed for allergies.   Yes Historical Provider, MD  montelukast (SINGULAIR) 10 MG tablet Take 1 tablet (10 mg total) by mouth at bedtime. 05/12/15  Yes Jessica Priest, MD  acetaminophen (TYLENOL) 500 MG tablet Take 1 tablet (500 mg total) by mouth every 6 (six) hours as needed. 06/06/15   Mady Gemma, PA-C  ibuprofen (ADVIL,MOTRIN) 600 MG tablet Take 1 tablet (600 mg total) by mouth every 6 (six) hours as needed. 06/06/15   Mady Gemma, PA-C  Olopatadine HCl (PAZEO) 0.7 % SOLN Place 1 drop into both eyes 1 day or 1 dose. Patient not taking: Reported on 06/06/2015 05/12/15   Jessica Priest, MD    BP 130/78 mmHg  Pulse 80  Temp(Src) 97.9 F (36.6 C) (Oral)  Resp 18  SpO2 98% Physical Exam  Constitutional: He is oriented to person, place, and time. He appears well-developed and well-nourished. No distress.  HENT:  Head: Normocephalic and atraumatic.  Right Ear: External ear normal.  Left Ear: External ear normal.  Nose: Nose normal.  Mouth/Throat: Uvula is midline and mucous membranes are normal. Posterior oropharyngeal erythema present. No oropharyngeal exudate, posterior oropharyngeal edema or tonsillar abscesses.  Eyes: Conjunctivae, EOM and lids are normal. Pupils are equal, round, and reactive  to light. Right eye exhibits no discharge. Left eye exhibits no discharge. No scleral icterus.  Neck: Normal range of motion. Neck supple.  Cardiovascular: Normal rate, regular rhythm, normal heart sounds, intact distal pulses and normal pulses.   Pulmonary/Chest: Effort normal and breath sounds normal. No respiratory  distress. He has no wheezes. He has no rales.  Abdominal: Soft. Normal appearance and bowel sounds are normal. He exhibits no distension and no mass. There is no tenderness. There is no rigidity, no rebound and no guarding.  Musculoskeletal: Normal range of motion. He exhibits no edema or tenderness.  Neurological: He is alert and oriented to person, place, and time. He has normal strength. No cranial nerve deficit or sensory deficit.  Skin: Skin is warm, dry and intact. No rash noted. He is not diaphoretic. No erythema. No pallor.  Psychiatric: He has a normal mood and affect. His speech is normal and behavior is normal.  Nursing note and vitals reviewed.   ED Course  Procedures (including critical care time)  Labs Review Labs Reviewed  CBC WITH DIFFERENTIAL/PLATELET - Abnormal; Notable for the following:    MCHC 36.4 (*)    All other components within normal limits  BASIC METABOLIC PANEL - Abnormal; Notable for the following:    Calcium 8.6 (*)    All other components within normal limits  URINALYSIS, ROUTINE W REFLEX MICROSCOPIC (NOT AT [facility])  CK  HEPATIC FUNCTION PANEL    Imaging Review No results found.   I have personally reviewed and evaluated these images and lab results as part of my medical decision-making.   EKG Interpretation None      MDM   Final diagnoses:  Body aches  Sore throat    35 year old male presents with generalized body aches. Also notes sore throat and fever to 100 at home. Denies headache, lightheadedness, dizziness, cough, congestion, difficulty swallowing or handling his secretions, chest pain, shortness of breath, abdominal pain, nausea, vomiting, diarrhea, constipation, dysuria, urgency, frequency, numbness, paresthesia. Patient evaluated at urgent care today prior to arrival, at which time he had a normal chest x-ray, negative flu test, negative strep test, negative mono test. He was sent to the ED for further evaluation.  Patient is  afebrile. Heart rate initially 110. Posterior oropharynx with mild erythema. No edema or exudate. Heart regular rhythm. Lungs clear to auscultation bilaterally. Abdomen soft, nontender, nondistended. Strength and sensation intact. Normal neuro exam with no focal deficit. No rash.  CBC negative for leukocytosis or anemia. BMP unremarkable. Hepatic function panel within normal limits. CK unremarkable. UA negative for infection. Patient given fluids and toradol.  On reassessment of patient, he reports symptom improvement s/p fluids. Patient is non-toxic and well-appearing, feel he is stable for discharge at this time. Symptoms likely viral. Advised to take tylenol/ibuprofen. Patient to follow-up with PCP. Strict return precautions discussed. Patient verbalizes his understanding and is in agreement with plan.   BP 130/78 mmHg  Pulse 80  Temp(Src) 97.9 F (36.6 C) (Oral)  Resp 18  SpO2 98%     Mady Gemma, PA-C 06/07/15 0111  Loren Racer, MD 06/09/15 [id number]

## 2015-06-06 NOTE — ED Notes (Addendum)
Pt here for body aches since last week.  Went to urgent care and had all test done and were negative.  Pt states urgent care was concerned b/c bilateral noted weakness.  Neuro WNL.  No fever.  No cough/congestion.  Unknown maybe 100 fever at home. Pt has all lab and xray results with him.

## 2015-08-03 ENCOUNTER — Ambulatory Visit (INDEPENDENT_AMBULATORY_CARE_PROVIDER_SITE_OTHER): Payer: BLUE CROSS/BLUE SHIELD | Admitting: Allergy and Immunology

## 2015-08-03 ENCOUNTER — Encounter: Payer: Self-pay | Admitting: Allergy and Immunology

## 2015-08-03 VITALS — BP 108/70 | HR 64 | Resp 16

## 2015-08-03 DIAGNOSIS — H1010 Acute atopic conjunctivitis, unspecified eye: Secondary | ICD-10-CM | POA: Diagnosis not present

## 2015-08-03 DIAGNOSIS — J309 Allergic rhinitis, unspecified: Secondary | ICD-10-CM

## 2015-08-03 NOTE — Patient Instructions (Signed)
  1.  Allergen avoidance measures  2. Treat and prevent inflammation:   A. OTC Rhinocort - one spray each nostril 3-7 times per week  B. Montelukast 10 mg one tablet one time per day  3. If needed:   A. OTC antihistamine.  [name]. Pazeo - one drop each eye one time per day.    4. Immunotherapy?  5. Return in one year or earlier if problem.

## 2015-08-03 NOTE — Progress Notes (Signed)
Follow-up Note  Referring Provider: No ref. provider found Primary Provider: Darrow Jacobs,DIBAS, MD Date of Office Visit: 08/03/2015  Subjective:   Larry Jacobs (DOB: [birth year]) is a 35 y.o. male who [facility] in re-evaluation of the following:  HPI Comments: Larry Jacobs returns to this clinic in reevaluation of his allergic rhinoconjunctivitis. He is doing quite well with his current medical therapy which includes montelukast every day and Rhinocort about 3 times a week. He's had no problems with nasal congestion or sneezing and no problems with his eyes. He is performed allergen avoidance measures with inside the household. Overall he is very pleased with the response he is received from medical therapy.     Medication List           acetaminophen 500 MG tablet  Commonly known as:  TYLENOL  Take 1 tablet (500 mg total) by mouth every 6 (six) hours as needed.     cetirizine 10 MG tablet  Commonly known as:  ZYRTEC  Take 10 mg by mouth as needed for allergies.     ibuprofen 600 MG tablet  Commonly known as:  ADVIL,MOTRIN  Take 1 tablet (600 mg total) by mouth every 6 (six) hours as needed.     montelukast 10 MG tablet  Commonly known as:  SINGULAIR  Take 1 tablet (10 mg total) by mouth at bedtime.     Olopatadine HCl 0.7 % Soln  Commonly known as:  PAZEO  Place 1 drop into both eyes 1 day or 1 dose.     RHINOCORT ALLERGY 32 MCG/ACT nasal spray  Generic drug:  budesonide  Place 1 spray into both nostrils daily as needed for allergies.        History reviewed. No pertinent past medical history.  Past Surgical History  Procedure Laterality Date  . Elevation of depressed skull fracture      Allergies  Allergen Reactions  . Phenytoin     REACTION: hives    Review of systems negative except as noted in HPI / PMHx or noted below:  Review of Systems  Constitutional: Negative.   HENT: Negative.   Eyes: Negative.     Respiratory: Negative.   Cardiovascular: Negative.   Gastrointestinal: Negative.   Genitourinary: Negative.   Musculoskeletal: Negative.   Skin: Negative.   Neurological: Negative.   Endo/Heme/Allergies: Negative.   Psychiatric/Behavioral: Negative.      Objective:   Filed Vitals:   08/03/15 0823  BP: 108/70  Pulse: 64  Resp: 16          Physical Exam  Constitutional: He is well-developed, well-nourished, and in no distress.  HENT:  Head: Normocephalic.  Right Ear: Tympanic membrane, external ear and ear canal normal.  Left Ear: Tympanic membrane, external ear and ear canal normal.  Nose: Nose normal. No mucosal edema or rhinorrhea.  Mouth/Throat: Uvula is midline, oropharynx is clear and moist and mucous membranes are normal. No oropharyngeal exudate.  Eyes: Conjunctivae are normal.  Neck: Trachea normal. No tracheal tenderness present. No tracheal deviation present. No thyromegaly present.  Cardiovascular: Normal rate, regular rhythm, S1 normal, S2 normal and normal heart sounds.   No murmur heard. Pulmonary/Chest: Breath sounds normal. No stridor. No respiratory distress. He has no wheezes. He has no rales.  Musculoskeletal: He exhibits no edema.  Lymphadenopathy:       Head (right side): No tonsillar adenopathy present.       Head (left side): No tonsillar  adenopathy present.    He has no cervical adenopathy.  Neurological: He is alert. Gait normal.  Skin: No rash noted. He is not diaphoretic. No erythema. Nails show no clubbing.  Psychiatric: Mood and affect normal.    Diagnostics: None   Assessment and Plan:   1. Allergic rhinoconjunctivitis     1.  Allergen avoidance measures  2. Treat and prevent inflammation:   A. OTC Rhinocort - one spray each nostril 3-7 times per week  B. Montelukast 10 mg one tablet one time per day  3. If needed:   A. OTC antihistamine.  [name] - one drop each eye one time per day.    4. Immunotherapy?  5. Return in  one year or earlier if problem.  [name] has done quite well with his current medical therapy and I see no need for changing this treatment at this point in time. He is not interested in immunotherapy given the fact that he is received such a good response with medical treatment. Thus, I will see him back in this clinic in 1 year or earlier if there is a problem.  Laurette Schimke, MD [facility]

## 2015-12-13 ENCOUNTER — Other Ambulatory Visit: Payer: Self-pay | Admitting: Allergy and Immunology

## 2016-07-28 ENCOUNTER — Other Ambulatory Visit: Payer: Self-pay | Admitting: Allergy and Immunology

## 2016-10-06 ENCOUNTER — Other Ambulatory Visit: Payer: Self-pay | Admitting: Allergy and Immunology
